# Patient Record
Sex: Female | Born: 1995 | Race: White | Hispanic: No | Marital: Single | Smoking: Never smoker
Health system: Southern US, Community
[De-identification: ages and names within clinical notes are randomized; demographics above are authoritative.]

## PROBLEM LIST (undated history)

## (undated) HISTORY — PX: WRIST ARTHROCENTESIS: SUR48

---

## 2016-05-01 ENCOUNTER — Encounter: Payer: Self-pay | Admitting: Emergency Medicine

## 2016-05-01 ENCOUNTER — Emergency Department

## 2016-05-01 DIAGNOSIS — Y939 Activity, unspecified: Secondary | ICD-10-CM | POA: Diagnosis not present

## 2016-05-01 DIAGNOSIS — Y929 Unspecified place or not applicable: Secondary | ICD-10-CM | POA: Insufficient documentation

## 2016-05-01 DIAGNOSIS — S90811A Abrasion, right foot, initial encounter: Secondary | ICD-10-CM | POA: Diagnosis not present

## 2016-05-01 DIAGNOSIS — Y999 Unspecified external cause status: Secondary | ICD-10-CM | POA: Insufficient documentation

## 2016-05-01 DIAGNOSIS — L03115 Cellulitis of right lower limb: Secondary | ICD-10-CM | POA: Insufficient documentation

## 2016-05-01 DIAGNOSIS — S70311A Abrasion, right thigh, initial encounter: Secondary | ICD-10-CM | POA: Diagnosis not present

## 2016-05-01 DIAGNOSIS — S8991XA Unspecified injury of right lower leg, initial encounter: Secondary | ICD-10-CM | POA: Diagnosis present

## 2016-05-01 DIAGNOSIS — W101XXA Fall (on)(from) sidewalk curb, initial encounter: Secondary | ICD-10-CM | POA: Insufficient documentation

## 2016-05-01 DIAGNOSIS — S80811A Abrasion, right lower leg, initial encounter: Secondary | ICD-10-CM | POA: Insufficient documentation

## 2016-05-01 DIAGNOSIS — S90812A Abrasion, left foot, initial encounter: Secondary | ICD-10-CM | POA: Diagnosis not present

## 2016-05-01 NOTE — ED Triage Notes (Signed)
Pt states that she fell off of a curb on Thursday and hurt her ankle. Pt states that her road rash has spread at this point. Pt has swelling noted to right ankle. Pt is ambulatory to triage with NAD noted at this time.

## 2016-05-02 ENCOUNTER — Emergency Department
Admission: EM | Admit: 2016-05-02 | Discharge: 2016-05-02 | Disposition: A | Discharge status: Home / Self Care | Attending: Emergency Medicine | Admitting: Emergency Medicine

## 2016-05-02 DIAGNOSIS — L03115 Cellulitis of right lower limb: Secondary | ICD-10-CM

## 2016-05-02 DIAGNOSIS — T07XXXA Unspecified multiple injuries, initial encounter: Secondary | ICD-10-CM

## 2016-05-02 MED ORDER — CEPHALEXIN 500 MG PO CAPS
500.0000 mg | ORAL_CAPSULE | Freq: Once | ORAL | Status: AC
  Administered 2016-05-02: 500 mg via ORAL
  Filled 2016-05-02: qty 1

## 2016-05-02 MED ORDER — TRAMADOL HCL 50 MG PO TABS: 50.0000 mg | ORAL_TABLET | Freq: Four times a day (QID) | ORAL | 0 refills | Status: AC | PRN

## 2016-05-02 MED ORDER — TRAMADOL HCL 50 MG PO TABS
50.0000 mg | ORAL_TABLET | Freq: Once | ORAL | Status: AC
  Administered 2016-05-02: 50 mg via ORAL
  Filled 2016-05-02: qty 1

## 2016-05-02 MED ORDER — CEPHALEXIN 500 MG PO CAPS: 500.0000 mg | ORAL_CAPSULE | Freq: Four times a day (QID) | ORAL | 0 refills | Status: AC

## 2016-05-02 MED ORDER — BACITRACIN ZINC 500 UNIT/GM EX OINT
TOPICAL_OINTMENT | Freq: Two times a day (BID) | CUTANEOUS | Status: DC
  Administered 2016-05-02: 1 via TOPICAL
  Filled 2016-05-02: qty 0.9

## 2016-05-02 NOTE — ED Notes (Signed)
Patient fell a couple of days ago and scratched up her right hip, leg, and the top of her feet.  Patient has marked the top of her feet where the wounds were to show the redness is progressing beyond the area of intial injury.  The wound has been cleaned with water and hydrogen peroxide and covered with neosporin.  Patient is co 7/10 "burning" pain from the abrasion to her calf.

## 2016-05-02 NOTE — ED Provider Notes (Signed)
[facility] Emergency Department Provider Note   ____________________________________________   First MD Initiated Contact with Patient 05/02/16 [phone]     (approximate)  I have reviewed the triage vital signs and the nursing notes.   HISTORY  Chief Complaint Leg Pain    HPI Jacqueline Johnston is a 21 y.o. female who comes into the hospital today with a fall off the curb. She reports that she fell off the curb on Thursday and had some abrasions to her right leg. The patient reports initially she washed out with soap and water but then noticed it was red and hurting. She reports that today she washed it out with hydroperoxide. They drew a circle around the area and watches it got more red. She reports that she's been placing some Neosporin on it and cleaning with hydrogen peroxide but the area has gotten more red. Today she noticed that her ankle was getting swollen and her legs seem swollen and puffy. She's been taking Tylenol and Motrin for pain but it has not been helping. The patient rates her pain a 7 out of 10 in intensity. She reports that she takes one Motrin in the morning one in the afternoon and 2 at night. The patient decided to come in and get checked out to make sure that she wasn't having an infection.The patient has not had any fevers, nausea, vomiting, abdominal pain, chest pain, shortness of breath.   History reviewed. No pertinent past medical history.  There are no active problems to display for this patient.   Past Surgical History:  Procedure Laterality Date  . WRIST ARTHROCENTESIS     cyst removal    Prior to Admission medications   Medication Sig Start Date End Date Taking? Authorizing Provider  cephALEXin (KEFLEX) 500 MG capsule Take 1 capsule (500 mg total) by mouth 4 (four) times daily. 05/02/16 05/12/16  Rebecka ApleyAllison P Webster, MD  traMADol (ULTRAM) 50 MG tablet Take 1 tablet (50 mg total) by mouth every 6 (six) hours as needed.  05/02/16   Rebecka ApleyAllison P Webster, MD    Allergies Patient has no known allergies.  No family history on file.  Social History Social History  Substance Use Topics  . Smoking status: Never Smoker  . Smokeless tobacco: Never Used  . Alcohol use 9.0 oz/week    15 Glasses of wine per week    Review of Systems Constitutional: No fever/chills Eyes: No visual changes. ENT: No sore throat. Cardiovascular: Denies chest pain. Respiratory: Denies shortness of breath. Gastrointestinal: No abdominal pain.  No nausea, no vomiting.  No diarrhea.  No constipation. Genitourinary: Negative for dysuria. Musculoskeletal: Negative for back pain. Skin: Right leg abrasions and erythema Neurological: Negative for headaches, focal weakness or numbness.  10-point ROS otherwise negative.  ____________________________________________   PHYSICAL EXAM:  VITAL SIGNS: ED Triage Vitals  Enc Vitals Group     BP 05/01/16 2253 124/71     Pulse Rate 05/01/16 2253 80     Resp 05/01/16 2253 16     Temp 05/01/16 2253 98.5 F (36.9 C)     Temp Source 05/01/16 2253 Oral     SpO2 05/01/16 2253 98 %     Weight 05/01/16 2255 170 lb (77.1 kg)     Height 05/01/16 2255 5\' 2"  (1.575 m)     Head Circumference --      Peak Flow --      Pain Score 05/01/16 2255 7     Pain Loc --  Pain Edu? --      Excl. in GC? --     Constitutional: Alert and oriented. Well appearing and in mild distress. Eyes: Conjunctivae are normal. PERRL. EOMI. Head: Atraumatic. Nose: No congestion/rhinnorhea. Mouth/Throat: Mucous membranes are moist.  Oropharynx non-erythematous. Cardiovascular: Normal rate, regular rhythm. Grossly normal heart sounds.  Good peripheral circulation. Respiratory: Normal respiratory effort.  No retractions. Lungs CTAB. Gastrointestinal: Soft and nontender. No distention. Positive bowel sounds Musculoskeletal: No lower extremity tenderness nor edema.   Neurologic:  Normal speech and language.  Skin:   Skin is warm, dry and intact. Abrasion to right lower leg with some erythema, abrasions to right and left foot. Abrasion to right proximal thigh/gluteal region Psychiatric: Mood and affect are normal.   ____________________________________________   LABS (all labs ordered are listed, but only abnormal results are displayed)  Labs Reviewed - No data to display ____________________________________________  EKG  none ____________________________________________  RADIOLOGY  Ankle xray ____________________________________________   PROCEDURES  Procedure(s) performed: None  Procedures  Critical Care performed: No  ____________________________________________   INITIAL IMPRESSION / ASSESSMENT AND PLAN / ED COURSE  Pertinent labs & imaging results that were available during my care of the patient were reviewed by me and considered in my medical decision making (see chart for details).  This is a 21 year old female who comes into the hospital today with an abrasion to her leg. The patient reports it is been getting red and swollen. The patient was concerned about infection. The patient does have some erythema and swelling so I will treat her for cellulitis. The patient's abrasions are not draining at this time. The patient's ankle also has no fracture according to the x-ray. I will give the patient a dose of tramadol for pain as well as some Keflex for infection. I will also have the nurse clean the patient's leg and redress it using some bacitracin. I did inform the patient to no longer use hydrogen peroxide as it can be cytotoxic. I will discharge the patient home and have her follow-up with her primary care physician. The patient will receive some crutches to help with ambulation given her discomfort.  Clinical Course as of May 02 108  Wynelle Link May 02, 2016  0042 No fracture or dislocation is seen. DG Ankle Complete Right [AW]    Clinical Course User Index [AW] Rebecka Apley,  MD     ____________________________________________   FINAL CLINICAL IMPRESSION(S) / ED DIAGNOSES  Final diagnoses:  Cellulitis of right lower extremity  Multiple abrasions      NEW MEDICATIONS STARTED DURING THIS VISIT:  New Prescriptions   CEPHALEXIN (KEFLEX) 500 MG CAPSULE    Take 1 capsule (500 mg total) by mouth 4 (four) times daily.   TRAMADOL (ULTRAM) 50 MG TABLET    Take 1 tablet (50 mg total) by mouth every 6 (six) hours as needed.     Note:  This document was prepared using Dragon voice recognition software and may include unintentional dictation errors.    Rebecka Apley, MD 05/02/16 0110

## 2016-05-02 NOTE — ED Notes (Signed)
Wounds cleaned up and nonstick dressing applied to wounds, wrapped with gauze and taped.  Gave patient Ace bandages in case the gauze started to loosen as she walked.  Patient educated on crutch use and she demonstrated repeat demonstration with their use.

## 2018-04-01 IMAGING — CR DG ANKLE COMPLETE 3+V*R*
1 series · 3 of 3 positions shown · non-contrast
Comparison: None.

CLINICAL DATA: Lateral ankle pain, status post fall

EXAM:
RIGHT ANKLE - COMPLETE 3+ VIEW

[Series 1: dg ankle complete right · 0.14mm/px · 3 of 3 slices shown]
[im 1/3]
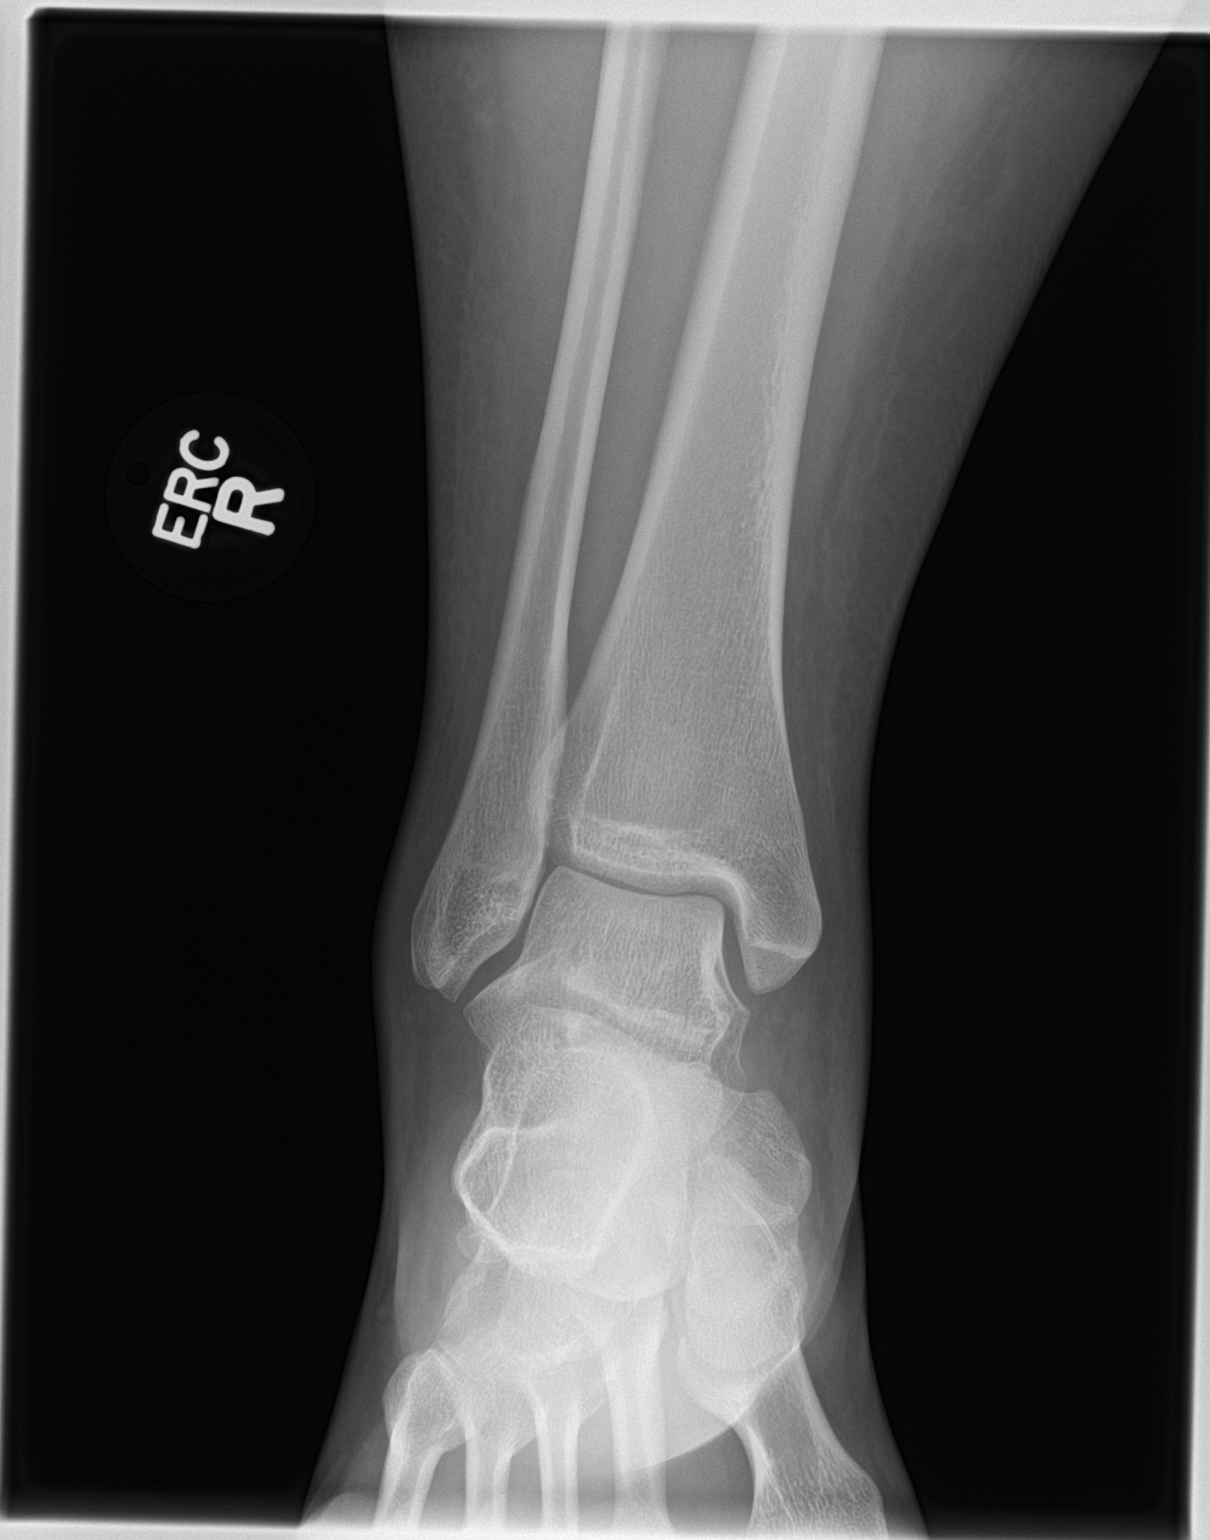
[im 2/3]
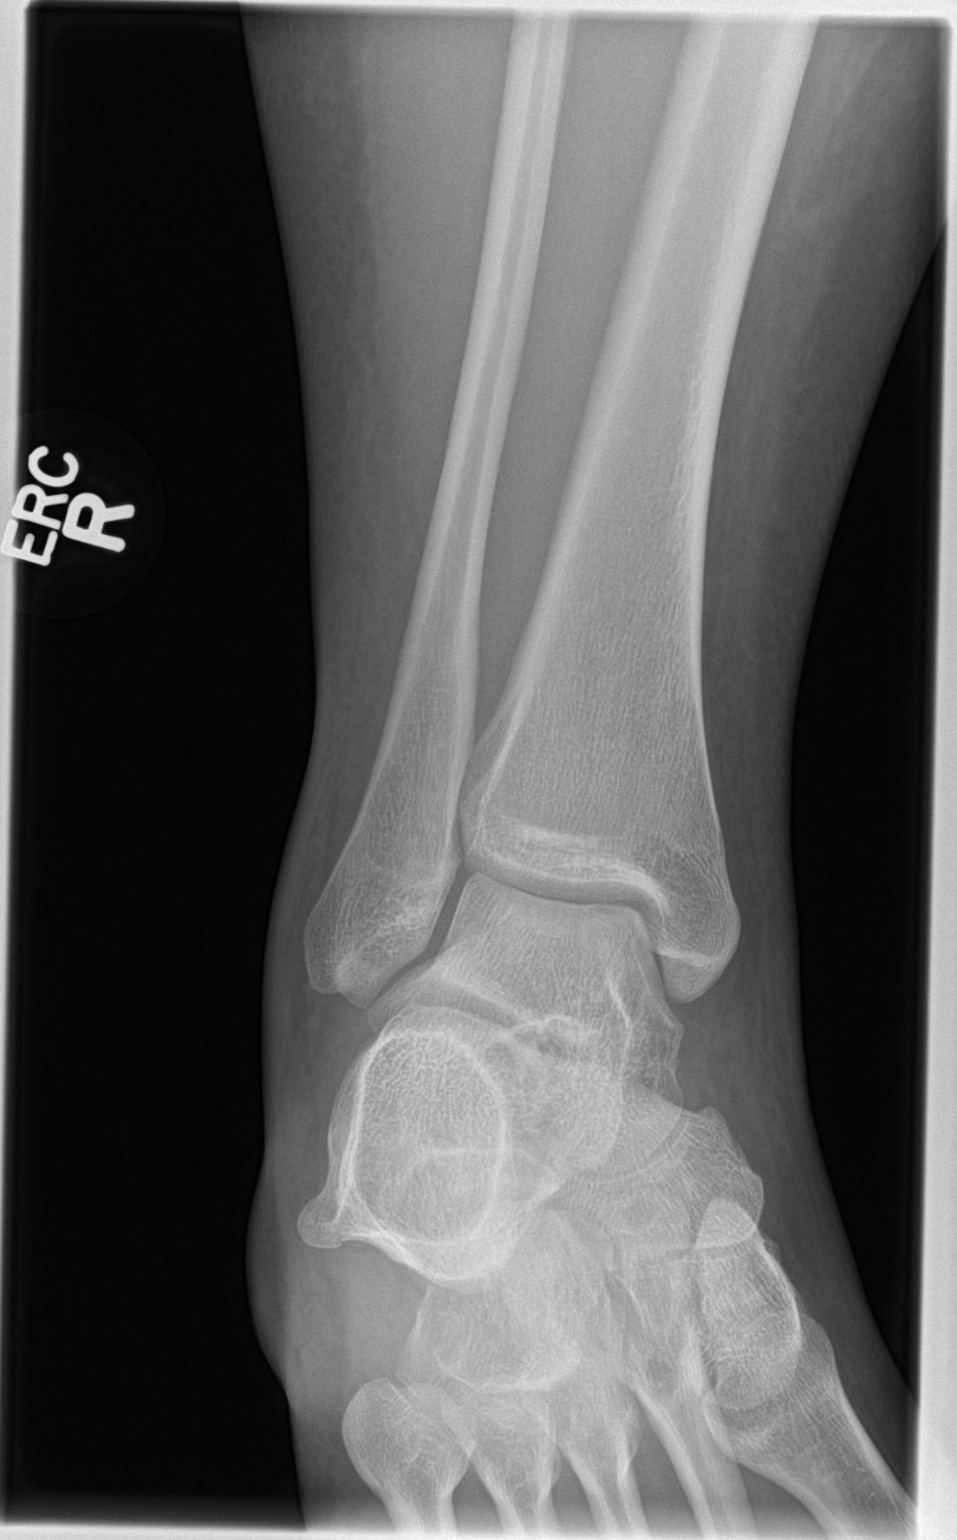
[im 3/3]
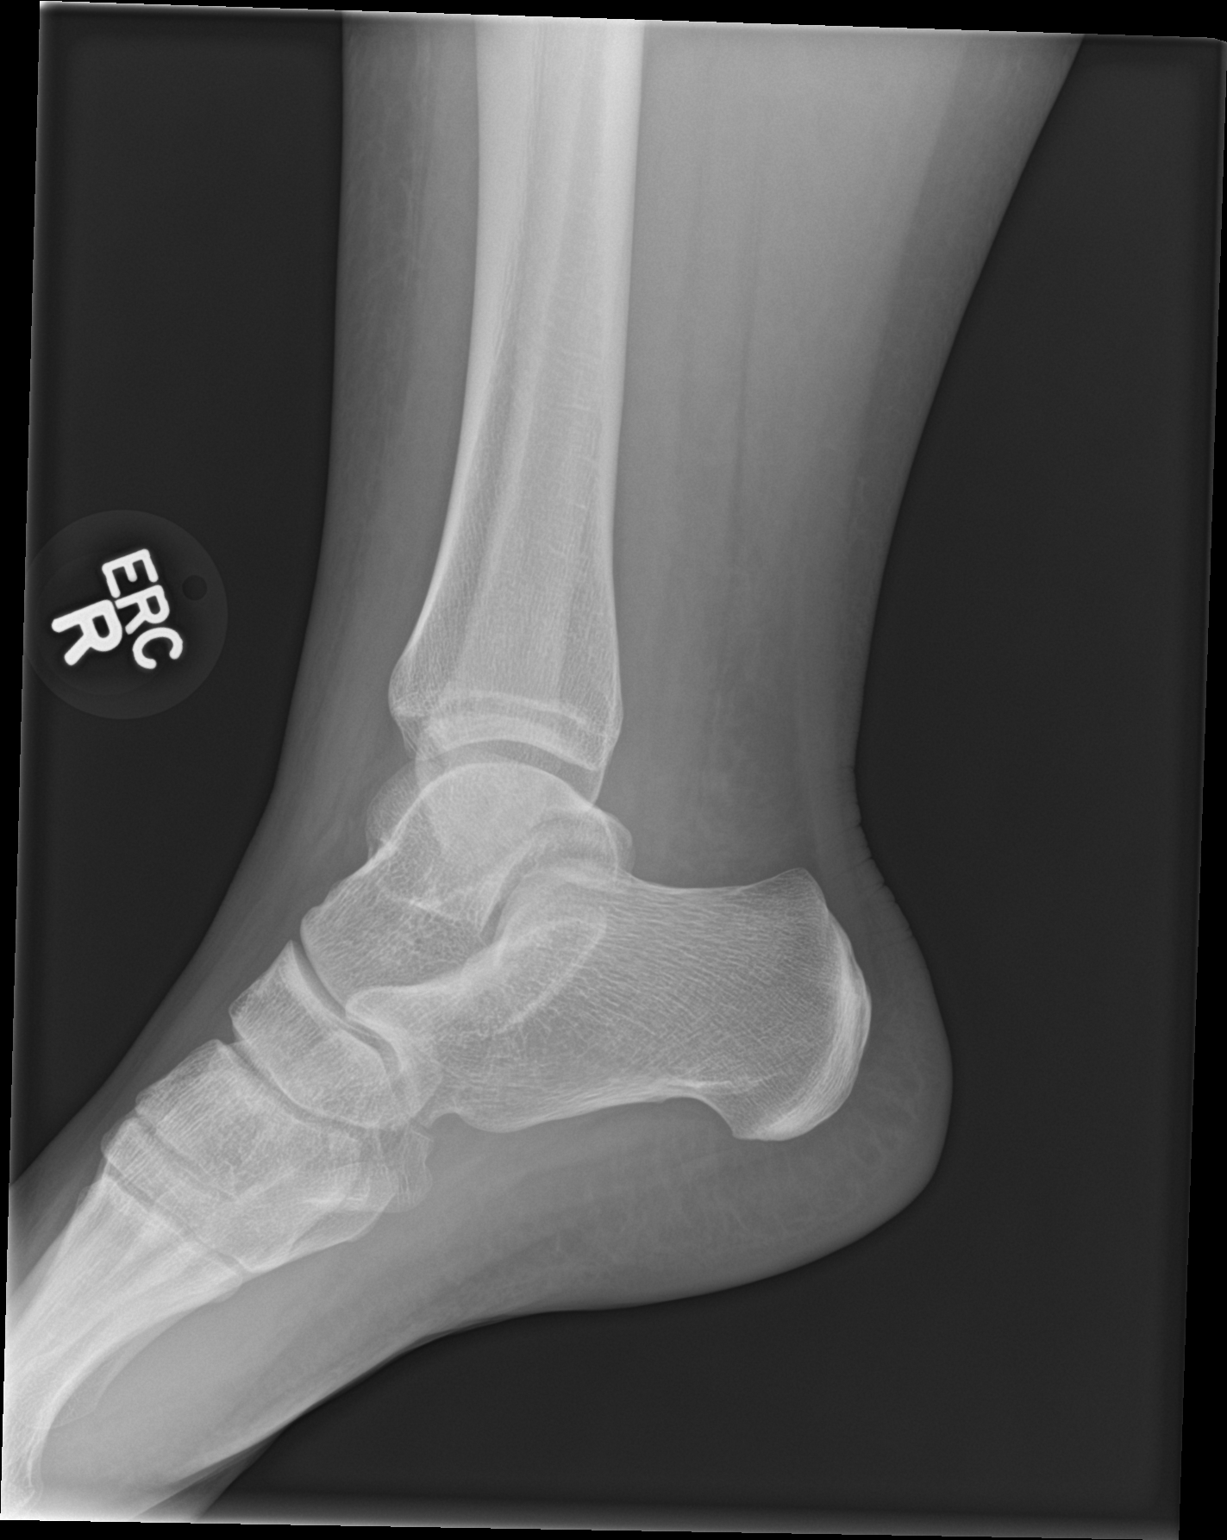

[3 of 3 positions shown; findings below may reference images not displayed]

FINDINGS: No fracture or dislocation is seen.

The ankle mortise is intact.

The base of the fifth metatarsal is unremarkable.

The visualized soft tissues are unremarkable.
IMPRESSION: No fracture or dislocation is seen.
